# Patient Record
Sex: Female | Born: 1990 | Race: Black or African American | Hispanic: No | Marital: Single | Smoking: Never smoker
Health system: Southern US, Community
[De-identification: ages and names within clinical notes are randomized; demographics above are authoritative.]

## PROBLEM LIST (undated history)

## (undated) HISTORY — PX: WISDOM TOOTH EXTRACTION: SHX21

## (undated) HISTORY — PX: CHOLECYSTECTOMY: SHX55

---

## 2012-05-20 ENCOUNTER — Emergency Department (HOSPITAL_COMMUNITY)

## 2012-05-20 ENCOUNTER — Emergency Department (HOSPITAL_COMMUNITY)
Admission: EM | Admit: 2012-05-20 | Discharge: 2012-05-20 | Disposition: A | Discharge status: Home / Self Care | Attending: Emergency Medicine | Admitting: Emergency Medicine

## 2012-05-20 ENCOUNTER — Encounter (HOSPITAL_COMMUNITY): Payer: Self-pay | Admitting: Emergency Medicine

## 2012-05-20 DIAGNOSIS — R112 Nausea with vomiting, unspecified: Secondary | ICD-10-CM | POA: Insufficient documentation

## 2012-05-20 DIAGNOSIS — K8020 Calculus of gallbladder without cholecystitis without obstruction: Secondary | ICD-10-CM | POA: Insufficient documentation

## 2012-05-20 DIAGNOSIS — K8050 Calculus of bile duct without cholangitis or cholecystitis without obstruction: Secondary | ICD-10-CM

## 2012-05-20 DIAGNOSIS — R109 Unspecified abdominal pain: Secondary | ICD-10-CM

## 2012-05-20 LAB — CBC WITH DIFFERENTIAL/PLATELET
Eosinophils Absolute: 0 10*3/uL (ref 0.0–0.7)
Eosinophils Relative: 0 % (ref 0–5)
HCT: 34.9 % — ABNORMAL LOW (ref 36.0–46.0)
Lymphocytes Relative: 25 % (ref 12–46)
Lymphs Abs: 1.5 10*3/uL (ref 0.7–4.0)
MCH: 31.6 pg (ref 26.0–34.0)
MCV: 89 fL (ref 78.0–100.0)
Monocytes Absolute: 0.4 10*3/uL (ref 0.1–1.0)
Platelets: 220 10*3/uL (ref 150–400)
RBC: 3.92 MIL/uL (ref 3.87–5.11)
WBC: 5.9 10*3/uL (ref 4.0–10.5)

## 2012-05-20 LAB — PREGNANCY, URINE: Preg Test, Ur: NEGATIVE

## 2012-05-20 LAB — URINALYSIS, ROUTINE W REFLEX MICROSCOPIC
Nitrite: NEGATIVE
Protein, ur: NEGATIVE mg/dL
Specific Gravity, Urine: 1.026 (ref 1.005–1.030)
Urobilinogen, UA: 1 mg/dL (ref 0.0–1.0)

## 2012-05-20 LAB — LIPASE, BLOOD: Lipase: 24 U/L (ref 11–59)

## 2012-05-20 LAB — COMPREHENSIVE METABOLIC PANEL
BUN: 10 mg/dL (ref 6–23)
CO2: 20 mEq/L (ref 19–32)
Calcium: 9.9 mg/dL (ref 8.4–10.5)
Creatinine, Ser: 0.77 mg/dL (ref 0.50–1.10)
GFR calc Af Amer: >90 mL/min (ref >90)
GFR calc non Af Amer: >90 mL/min (ref >90)
Glucose, Bld: 110 mg/dL — ABNORMAL HIGH (ref 70–99)
Total Protein: 7.9 g/dL (ref 6.0–8.3)

## 2012-05-20 LAB — URINE MICROSCOPIC-ADD ON

## 2012-05-20 MED ORDER — OXYCODONE-ACETAMINOPHEN 5-325 MG PO TABS: ORAL_TABLET | ORAL | Status: DC

## 2012-05-20 MED ORDER — MORPHINE SULFATE 4 MG/ML IJ SOLN
4.0000 mg | Freq: Once | INTRAMUSCULAR | Status: AC
  Administered 2012-05-20: 4 mg via INTRAMUSCULAR
  Filled 2012-05-20: qty 1

## 2012-05-20 MED ORDER — KETOROLAC TROMETHAMINE 60 MG/2ML IM SOLN
60.0000 mg | Freq: Once | INTRAMUSCULAR | Status: AC
  Administered 2012-05-20: 60 mg via INTRAMUSCULAR
  Filled 2012-05-20: qty 2

## 2012-05-20 MED ORDER — ONDANSETRON HCL 4 MG PO TABS: 4.0000 mg | ORAL_TABLET | Freq: Three times a day (TID) | ORAL | Status: DC | PRN

## 2012-05-20 MED ORDER — ONDANSETRON 8 MG PO TBDP
8.0000 mg | ORAL_TABLET | Freq: Once | ORAL | Status: AC
  Administered 2012-05-20: 8 mg via ORAL
  Filled 2012-05-20: qty 1

## 2012-05-20 MED ORDER — POTASSIUM CHLORIDE 20 MEQ/15ML (10%) PO LIQD
40.0000 meq | Freq: Once | ORAL | Status: AC
  Administered 2012-05-20: 40 meq via ORAL
  Filled 2012-05-20: qty 30

## 2012-05-20 NOTE — ED Notes (Signed)
Patient presents with upper abdominal pain that radiates to back.  Patient denies fevers.  Patient also reports one episode of vomiting.

## 2012-05-20 NOTE — ED Notes (Signed)
Per EMS pt complaining of lower back pain, nausea and vomiting this morning, is on menstrual cycle but this is not the same cramping she's use to. When EMS were initially called out it was for Shortness of breath, RA 100%. Pt told them she has had some episodes of the same in the past, not stress related. BP 106/70, HR 50, RR 16. Pain 8/10.

## 2012-05-20 NOTE — ED Provider Notes (Signed)
History     CSN: [id number]  Arrival date & time 05/20/12  1329   First MD Initiated Contact with Patient 05/20/12 1617      Chief Complaint  Patient presents with  . Back Pain  . Nausea  . Emesis     HPI Pt was seen at 1650.   Per pt, c/o gradual onset and persistence of constant upper abd "pain" since this morning.  Has been associated with one episode of N/V.  Describes the abd pain as "cramping," and radiating into her back.  Denies diarrhea, no fevers, no back pain, no rash, no CP/SOB, no black or blood in stools or emesis.      History reviewed. No pertinent past medical history.  History reviewed. No pertinent past surgical history.    History  Substance Use Topics  . Smoking status: Never Smoker   . Smokeless tobacco: Not on file  . Alcohol Use: No     Review of Systems ROS: Statement: All systems negative except as marked or noted in the HPI; Constitutional: Negative for fever and chills. ; ; Eyes: Negative for eye pain, redness and discharge. ; ; ENMT: Negative for ear pain, hoarseness, nasal congestion, sinus pressure and sore throat. ; ; Cardiovascular: Negative for chest pain, palpitations, diaphoresis, dyspnea and peripheral edema. ; ; Respiratory: Negative for cough, wheezing and stridor. ; ; Gastrointestinal: +abd pain, N/V. Negative for diarrhea, blood in stool, hematemesis, jaundice and rectal bleeding. . ; ; Genitourinary: Negative for dysuria, flank pain and hematuria. ; ; Musculoskeletal: Negative for back pain and neck pain. Negative for swelling and trauma.; ; Skin: Negative for pruritus, rash, abrasions, blisters, bruising and skin lesion.; ; Neuro: Negative for headache, lightheadedness and neck stiffness. Negative for weakness, altered level of consciousness , altered mental status, extremity weakness, paresthesias, involuntary movement, seizure and syncope.      Allergies  Review of patient's allergies indicates no known allergies.  Home  Medications   Current Outpatient Rx  Name  Route  Sig  Dispense  Refill  . Multiple Vitamin (MULTIVITAMIN WITH MINERALS) TABS   Oral   Take 1 tablet by mouth daily.           BP 107/56  Pulse 62  Temp(Src) 97.3 F (36.3 C) (Oral)  Resp 16  SpO2 100%  LMP 05/16/2012  Physical Exam 1655: Physical examination:  Nursing notes reviewed; Vital signs and O2 SAT reviewed;  Constitutional: Well developed, Well nourished, Well hydrated, Uncomfortable appearing; Head:  Normocephalic, atraumatic; Eyes: EOMI, PERRL, No scleral icterus; ENMT: Mouth and pharynx normal, Mucous membranes moist; Neck: Supple, Full range of motion, No lymphadenopathy; Cardiovascular: Regular rate and rhythm, No murmur, rub, or gallop; Respiratory: Breath sounds clear & equal bilaterally, No rales, rhonchi, wheezes.  Speaking full sentences with ease, Normal respiratory effort/excursion; Chest: Nontender, Movement normal; Abdomen: Soft, +mild RUQ, mid-epigastric, LUQ tenderness to palp. No rebound or guarding. Nondistended, Normal bowel sounds; Genitourinary: No CVA tenderness; Extremities: Pulses normal, No tenderness, No edema, No calf edema or asymmetry.; Neuro: AA&Ox3, Major CN grossly intact.  Speech clear. No gross focal motor or sensory deficits in extremities.; Skin: Color normal, Warm, Dry.   ED Course  Procedures    MDM  MDM Reviewed: previous chart, nursing note and vitals Reviewed previous: labs Interpretation: labs and ultrasound   Results for orders placed during the hospital encounter of 05/20/12  CBC WITH DIFFERENTIAL      Result Value Range   WBC 5.9  4.0 -  10.5 K/uL   RBC 3.92  3.87 - 5.11 MIL/uL   Hemoglobin 12.4  12.0 - 15.0 g/dL   HCT 52.8 (*) 41.3 - 24.4 %   MCV 89.0  78.0 - 100.0 fL   MCH 31.6  26.0 - 34.0 pg   MCHC 35.5  30.0 - 36.0 g/dL   RDW 01.0  27.2 - 53.6 %   Platelets 220  150 - 400 K/uL   Neutrophils Relative 67  43 - 77 %   Neutro Abs 4.0  1.7 - 7.7 K/uL   Lymphocytes  Relative 25  12 - 46 %   Lymphs Abs 1.5  0.7 - 4.0 K/uL   Monocytes Relative 7  3 - 12 %   Monocytes Absolute 0.4  0.1 - 1.0 K/uL   Eosinophils Relative 0  0 - 5 %   Eosinophils Absolute 0.0  0.0 - 0.7 K/uL   Basophils Relative 0  0 - 1 %   Basophils Absolute 0.0  0.0 - 0.1 K/uL  COMPREHENSIVE METABOLIC PANEL      Result Value Range   Sodium 141  135 - 145 mEq/L   Potassium 3.1 (*) 3.5 - 5.1 mEq/L   Chloride 105  96 - 112 mEq/L   CO2 20  19 - 32 mEq/L   Glucose, Bld 110 (*) 70 - 99 mg/dL   BUN 10  6 - 23 mg/dL   Creatinine, Ser 6.44  0.50 - 1.10 mg/dL   Calcium 9.9  8.4 - 03.4 mg/dL   Total Protein 7.9  6.0 - 8.3 g/dL   Albumin 4.8  3.5 - 5.2 g/dL   AST 19  0 - 37 U/L   ALT 12  0 - 35 U/L   Alkaline Phosphatase 31 (*) 39 - 117 U/L   Total Bilirubin 0.4  0.3 - 1.2 mg/dL   GFR calc non Af Amer >90  >90 mL/min   GFR calc Af Amer >90  >90 mL/min  LIPASE, BLOOD      Result Value Range   Lipase 24  11 - 59 U/L  URINALYSIS, ROUTINE W REFLEX MICROSCOPIC      Result Value Range   Color, Urine YELLOW  YELLOW   APPearance TURBID (*) CLEAR   Specific Gravity, Urine 1.026  1.005 - 1.030   pH 8.0  5.0 - 8.0   Glucose, UA NEGATIVE  NEGATIVE mg/dL   Hgb urine dipstick MODERATE (*) NEGATIVE   Bilirubin Urine NEGATIVE  NEGATIVE   Ketones, ur 15 (*) NEGATIVE mg/dL   Protein, ur NEGATIVE  NEGATIVE mg/dL   Urobilinogen, UA 1.0  0.0 - 1.0 mg/dL   Nitrite NEGATIVE  NEGATIVE   Leukocytes, UA TRACE (*) NEGATIVE  PREGNANCY, URINE      Result Value Range   Preg Test, Ur NEGATIVE  NEGATIVE  URINE MICROSCOPIC-ADD ON      Result Value Range   Squamous Epithelial / LPF RARE  RARE   WBC, UA 0-2  <3 WBC/hpf   RBC / HPF 7-10  <3 RBC/hpf   Urine-Other AMORPHOUS URATES/PHOSPHATES     US Abdomen Complete 05/20/2012  *RADIOLOGY REPORT*  Clinical Data:  The upper abdominal pain, 2 months duration  COMPLETE ABDOMINAL ULTRASOUND  Comparison:  None.  Findings:  Gallbladder:  There are two gallstones, one  free within the gallbladder and the other apparently impacted in the gallbladder neck.  This stone measures 2 cm in diameter.  No gallbladder wall thickening or pericholecystic fluid.  Common  bile duct:  Normal at 3.5 mm.  Liver:  Normal echogenicity.  No focal lesions.  IVC:  Normal  Pancreas:  Normal  Spleen:  Normal at 5.7 cm  Right Kidney:  Normal at 13.1 cm.  Normal echogenicity.  No cyst, mass, stone or hydronephrosis  Left Kidney:  Similarly normal at 12.3 cm  Abdominal aorta:  Normal.  No aneurysm  No free fluid  IMPRESSION: Two large gallstones, one apparently impacted in the gallbladder neck.  No sonographic evidence of cholecystitis at this moment.   Original Report Authenticated By: Paulina Fusi, M.D.     2000:  Gallstone in GB neck on [place].  No signs of acute cholecystitis at this time.  WBC count normal, pt afebrile, resps easy, abd soft/NT. Tol PO well while in the ED without N/V.  States she feels "better" after meds and wants to go home.  Has gotten dressed and is walking around the ED, NAD, resps easy, gait steady. T/C to General Surgery Dr. Gerrit Friends, case discussed, including:  HPI, pertinent PM/SHx, VS/PE, dx testing, ED course and treatment:  States no need to admit pt at this time with normal labs, afebrile, and pain under control; he took pt's name/DOB and will have his office staff call the pt tomorrow to schedule a f/u appt tomorrow to discuss having her GB removed.  Dx and testing, as well as d/w General Surgeon, d/w pt and family.  Questions answered.  Verb understanding, agreeable to d/c home with outpt f/u. Pt will call General Surgery office herself if she does not receive a call from them tomorrow morning.          Laray Anger, DO 05/23/12 [phone]

## 2012-05-21 ENCOUNTER — Telehealth (INDEPENDENT_AMBULATORY_CARE_PROVIDER_SITE_OTHER): Payer: Self-pay | Admitting: *Deleted

## 2012-05-21 NOTE — Telephone Encounter (Signed)
Called and spoke to patient following receiving a message from Gerkin MD that patient spoke with him last night regarding symptomatic gallstones.  Patient states she is feeling much better so a regular new patient appt scheduled for 06/03/12 with Gerkin MD.

## 2012-05-27 ENCOUNTER — Encounter (INDEPENDENT_AMBULATORY_CARE_PROVIDER_SITE_OTHER): Payer: Self-pay

## 2012-06-03 ENCOUNTER — Ambulatory Visit (INDEPENDENT_AMBULATORY_CARE_PROVIDER_SITE_OTHER): Admitting: Surgery

## 2012-06-03 ENCOUNTER — Encounter (INDEPENDENT_AMBULATORY_CARE_PROVIDER_SITE_OTHER): Payer: Self-pay | Admitting: Surgery

## 2012-06-03 VITALS — BP 104/68 | HR 56 | Temp 96.8°F | Ht 66.0 in | Wt 138.0 lb

## 2012-06-03 DIAGNOSIS — K8010 Calculus of gallbladder with chronic cholecystitis without obstruction: Secondary | ICD-10-CM | POA: Insufficient documentation

## 2012-06-03 NOTE — Progress Notes (Signed)
General Surgery [facility]  Chief Complaint  Patient presents with  . New Evaluation    eval gallbladder - referral from Dr. 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 Symptoms resolved with pain medication. She returns to our office today to discuss scheduling of cholecystectomy for symptomatic cholelithiasis.  Patient denies any prior history of hepatobiliary or pancreatic disease. She has had intermittent episodes of biliary colic associated with nausea and vomiting approximately once a month for approximately 9 months. Namely history is positive for cholelithiasis and the patient's father.  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  History reviewed. No pertinent past medical history.   Current Outpatient Prescriptions  Medication Sig Dispense Refill  . Multiple Vitamin (MULTIVITAMIN WITH MINERALS) TABS Take 1 tablet by mouth daily.       No current facility-administered medications for this visit.     No Known Allergies   Family History  Problem Relation Age of Onset  . Cancer Cousin     breast     History   Social History  . Marital Status: Single    Spouse Name: N/A    Number of Children: N/A  . Years of Education: N/A   Social History Main Topics  . Smoking status: Never Smoker   . Smokeless tobacco: None  . Alcohol Use: No  . Drug Use: No  . Sexually Active: No   Other Topics Concern  . None   Social History Narrative  . None     REVIEW OF SYSTEMS - PERTINENT POSITIVES ONLY: Denies jaundice. Denies acholic stools. No history of hepatobiliary or pancreatic disease.  EXAM: Filed Vitals:   06/03/12 1607  BP: 104/68  Pulse: 56  Temp: 96.8 F (36 C)    HEENT: normocephalic; pupils equal and reactive; sclerae clear; dentition good; mucous membranes moist NECK:  symmetric on  extension; no palpable anterior or posterior cervical lymphadenopathy; no supraclavicular masses; no tenderness CHEST: clear to auscultation bilaterally without rales, rhonchi, or wheezes CARDIAC: regular rate and rhythm without significant murmur; peripheral pulses are full ABDOMEN: soft without distension; bowel sounds present; no mass; no hepatosplenomegaly; no hernia EXT:  non-tender without edema; no deformity NEURO: no gross focal deficits; no sign of tremor   LABORATORY RESULTS: See [facility] (CHL-Epic) for most recent results   RADIOLOGY RESULTS: See [facility] (CHL-Epic) for most recent results   IMPRESSION: Symptomatic cholelithiasis, intermittent biliary colic  PLAN: The patient and I had a lengthy discussion regarding cholecystectomy. I provided her with written literature to review. I believe she will be an excellent candidate for laparoscopic cholecystectomy with intraoperative cholangiography. Since she is from out of town we will provide an overnight stay postoperatively. Patient and I discussed the procedure, the potential complications, and the postoperative recovery to be anticipated.  She understands and wishes to proceed in the near future.  The risks and benefits of the procedure have been discussed at length with the patient.  The patient understands the proposed procedure, potential alternative treatments, and the course of recovery to be expected.  All of the patient's questions have been answered at this time.  The patient wishes to proceed with surgery.  Velora Heckler, MD, FACS General & Endocrine Surgery [facility]   Visit Diagnoses: 1. Cholelithiasis with cholecystitis     Primary Care Physician: No  primary provider on file.

## 2012-06-03 NOTE — Patient Instructions (Signed)
  [facility]  LAPAROSCOPIC SURGERY - POST-OP INSTRUCTIONS  Always review your discharge instruction sheet given to you by the facility where your surgery was performed.  A prescription for pain medication may be given to you upon discharge.  Take your pain medication as prescribed.  If narcotic pain medicine is not needed, then you may take acetaminophen (Tylenol) or ibuprofen (Advil) as needed.  Take your usually prescribed medications unless otherwise directed.  If you need a refill on your pain medication, please contact your pharmacy.  They will contact our office to request authorization. Prescriptions will not be filled after 5 P.M. or on weekends.  You should follow a light diet the first few days after arrival home, such as soup and crackers or toast.  Be sure to include plenty of fluids daily.  Most patients will experience some swelling and bruising in the area of the incisions.  Ice packs will help.  Swelling and bruising can take several days to resolve.   It is common to experience some constipation if taking pain medication after surgery.  Increasing fluid intake and taking a stool softener (such as Colace) will usually help or prevent this problem from occurring.  A mild laxative (Milk of Magnesia or Miralax) should be taken according to package instructions if there are no bowel movements after 48 hours.  Unless discharge instructions indicate otherwise, you may remove your bandages 24-48 hours after surgery, and you may shower at that time.  You may have steri-strips (small skin tapes) in place directly over the incision.  These strips should be left on the skin for 7-10 days.  If your surgeon used skin glue on the incision, you may shower in 24 hours.  The glue will flake off over the next 2-3 weeks.  Any sutures or staples will be removed at the office during your follow-up visit.  ACTIVITIES:  You may resume regular (light) daily activities beginning the  next day-such as daily self-care, walking, climbing stairs-gradually increasing activities as tolerated.  You may have sexual intercourse when it is comfortable.  Refrain from any heavy lifting or straining until approved by your doctor.  You may drive when you are no longer taking prescription pain medication, you can comfortably wear a seatbelt, and you can safely maneuver your car and apply brakes.  You should see your doctor in the office for a follow-up appointment approximately 2-3 weeks after your surgery.  Make sure that you call for this appointment within a day or two after you arrive home to insure a convenient appointment time.  WHEN TO CALL YOUR DOCTOR: 1. Fever over 101.0 2. Inability to urinate 3. Continued bleeding from incision 4. Increased pain, redness, or drainage from the incision 5. Increasing abdominal pain  The clinic staff is available to answer your questions during regular business hours.  Please don't hesitate to call and ask to speak to one of the nurses for clinical concerns.  If you have a medical emergency, go to the nearest emergency room or call 911.  A surgeon from [facility] is always on call for the hospital.  Edgardo Petrenko M. Toluwani Yadav, MD, FACS [facility] Office: [phone] Toll Free:  [phone] FAX [phone]  Web site: [email or web address] 

## 2012-06-25 ENCOUNTER — Other Ambulatory Visit (INDEPENDENT_AMBULATORY_CARE_PROVIDER_SITE_OTHER): Payer: Self-pay | Admitting: Surgery

## 2012-06-25 DIAGNOSIS — K8010 Calculus of gallbladder with chronic cholecystitis without obstruction: Secondary | ICD-10-CM

## 2012-07-16 ENCOUNTER — Ambulatory Visit (INDEPENDENT_AMBULATORY_CARE_PROVIDER_SITE_OTHER): Admitting: Surgery

## 2012-07-16 ENCOUNTER — Encounter (INDEPENDENT_AMBULATORY_CARE_PROVIDER_SITE_OTHER): Payer: Self-pay | Admitting: Surgery

## 2012-07-16 VITALS — BP 112/58 | HR 52 | Temp 97.4°F | Resp 16 | Ht 66.0 in | Wt 141.0 lb

## 2012-07-16 DIAGNOSIS — K8010 Calculus of gallbladder with chronic cholecystitis without obstruction: Secondary | ICD-10-CM

## 2012-07-16 NOTE — Patient Instructions (Signed)
  COCOA BUTTER & VITAMIN E CREAM  ([employer] or other brand)  Apply cocoa butter/vitamin E cream to your incision 2 - 3 times daily.  Massage cream into incision for one minute with each application.  Use sunscreen (50 SPF or higher) for first 6 months after surgery if area is exposed to sun.  You may substitute Mederma or other scar reducing creams as desired.   

## 2012-07-16 NOTE — Progress Notes (Signed)
General Surgery [facility]  Visit Diagnoses: 1. Cholelithiasis with cholecystitis     HISTORY: Patient returns for postoperative visit having undergone laparoscopic cholecystectomy with intraoperative cholangiography on 06/25/2012. 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 Postoperative course has been uneventful.  EXAM: Abdominal incisions are well-healed. Remaining Steri-Strips are removed. No sign of herniation. No sign of infection. Right upper quadrant is soft and nontender without mass.  IMPRESSION: Status post laparoscopic cholecystectomy  PLAN: Patient is released to full activity without restriction. She will begin applying topical creams to her incisions. She will return for follow-up as needed.  Velora Heckler, MD, FACS General & Endocrine Surgery [facility]

## 2014-01-01 IMAGING — US US ABDOMEN COMPLETE
1 series · 14 of 25 positions shown · non-contrast
Comparison: None.

CLINICAL DATA: The upper abdominal pain, 2 months duration

COMPLETE ABDOMINAL ULTRASOUND

[Series 1: us abdomen complete · 0.22mm/px · 14 of 78 slices shown]
[im 1/78]
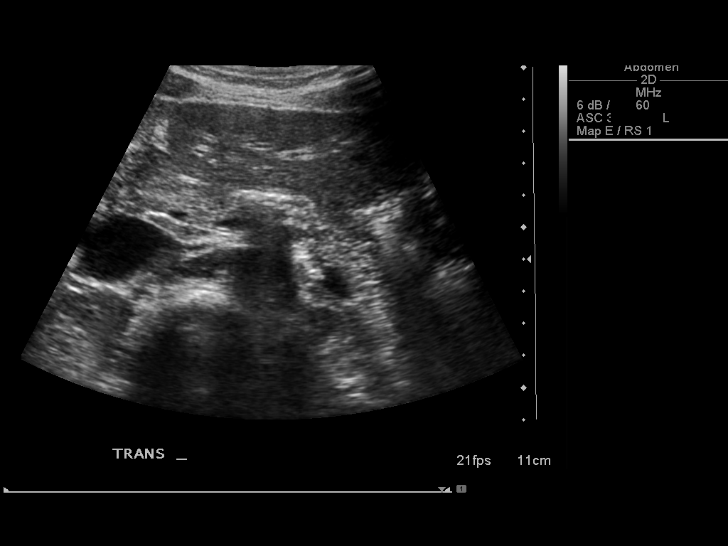
[im 7/78]
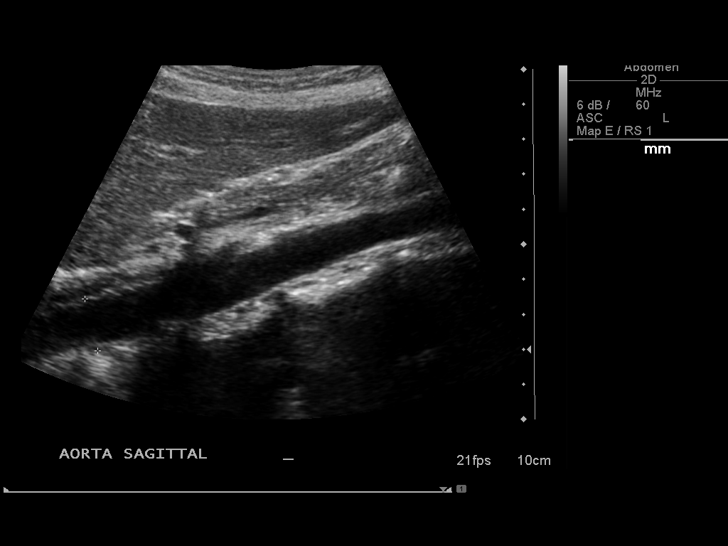
[im 13/78]
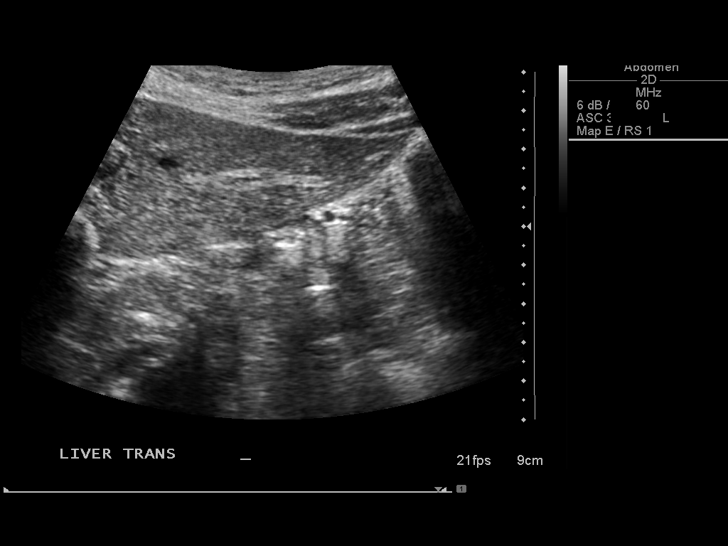
[im 20/78]
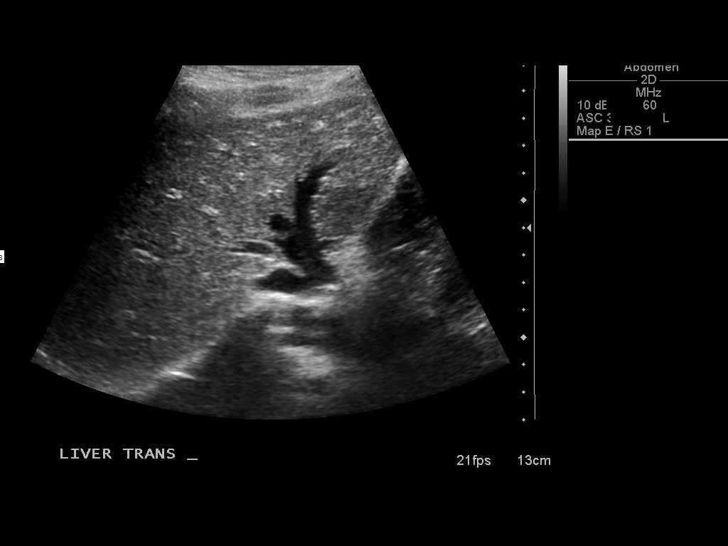
[im 26/78]
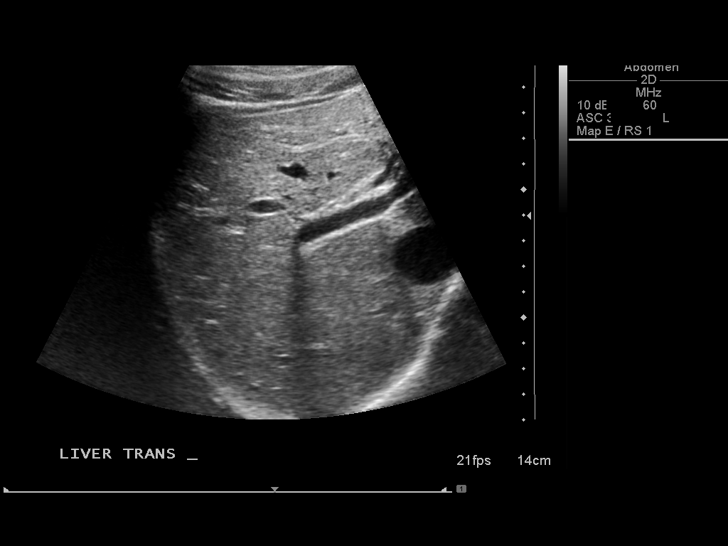
[im 29/78]
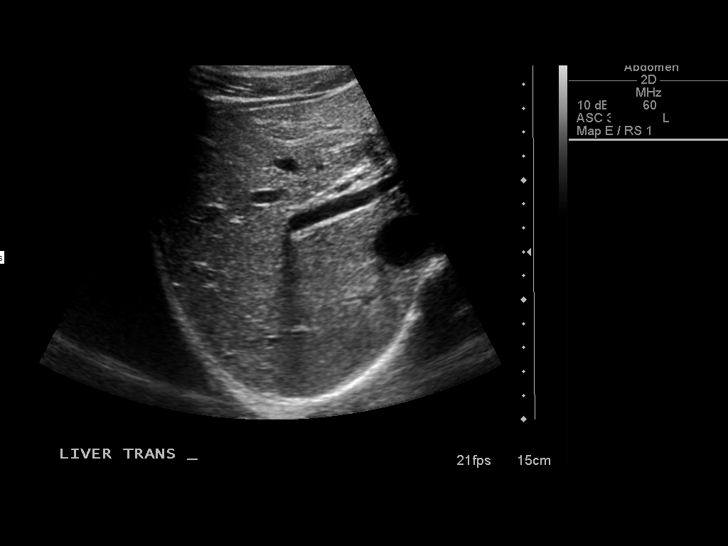
[im 36/78]
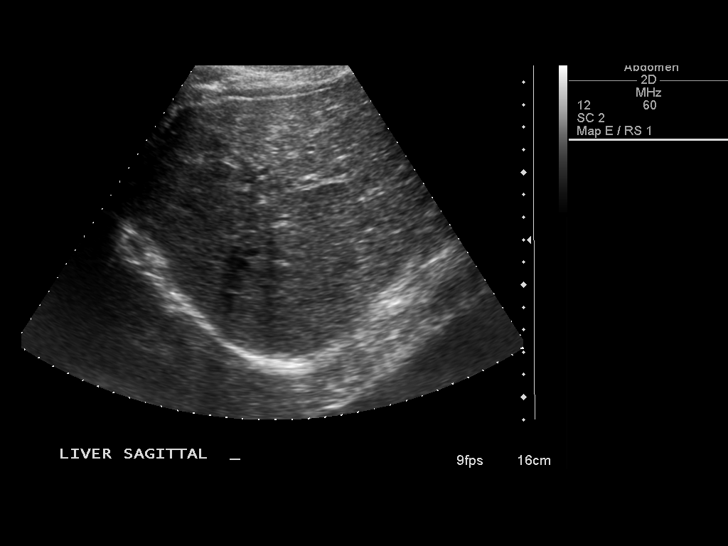
[im 42/78]
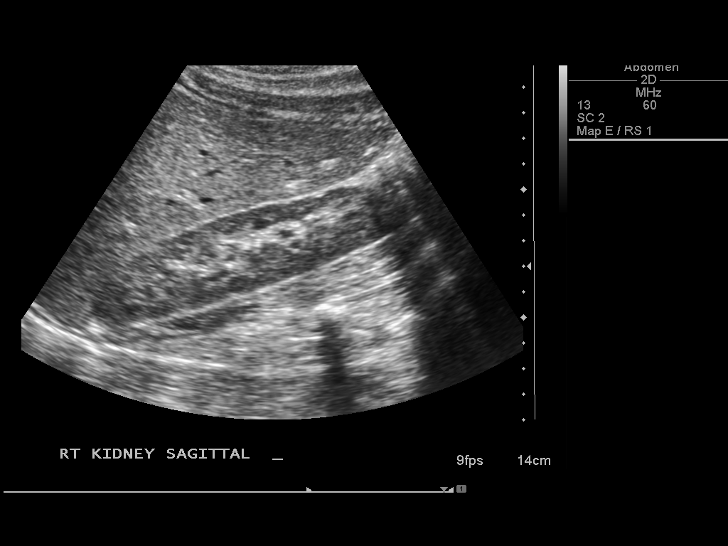
[im 49/78]
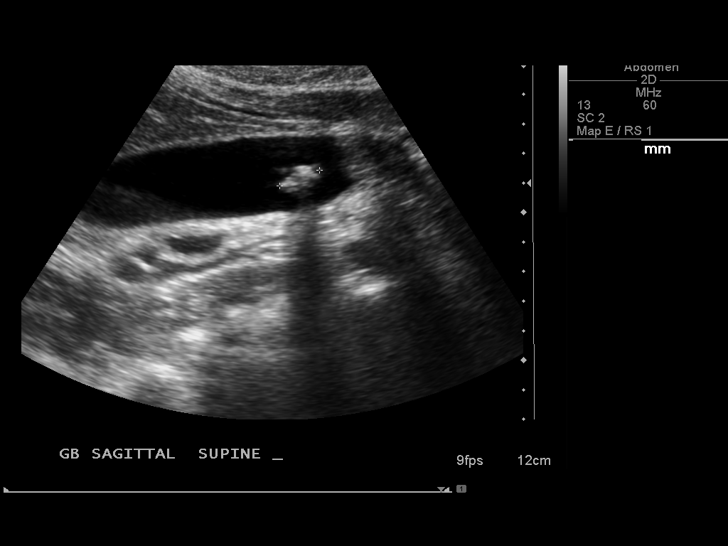
[im 52/78]
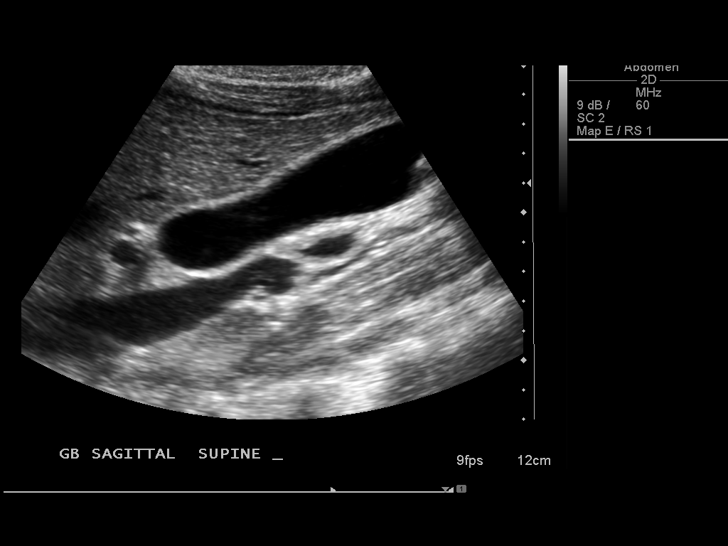
[im 58/78]
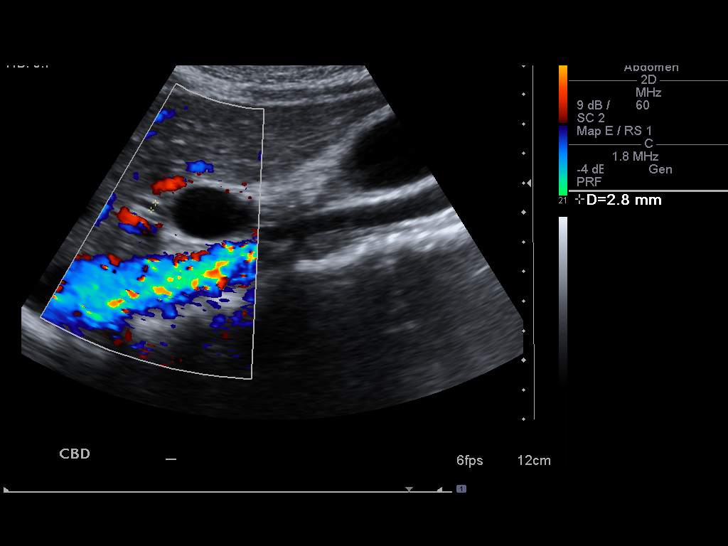
[im 65/78]
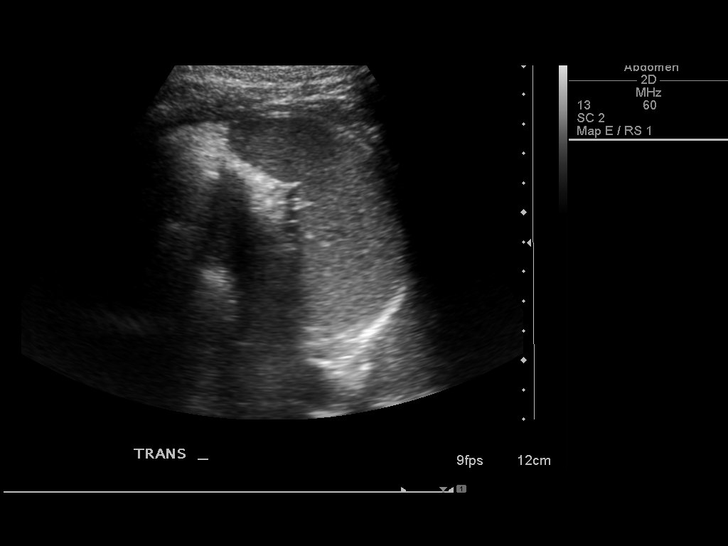
[im 71/78]
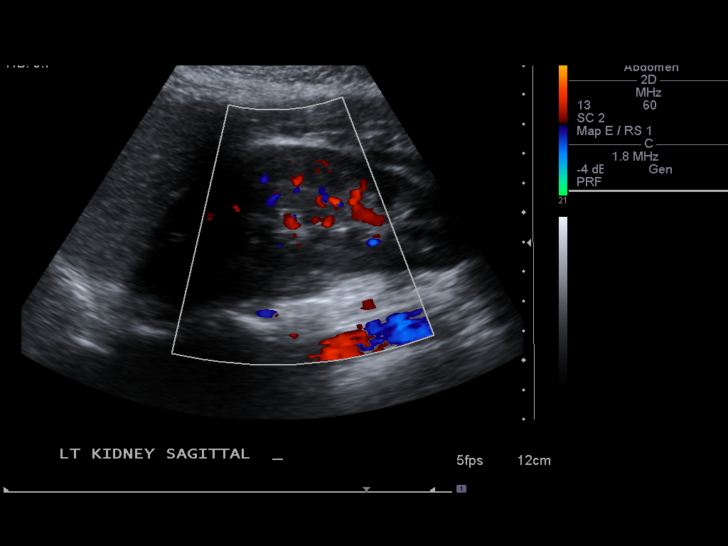
[im 78/78]
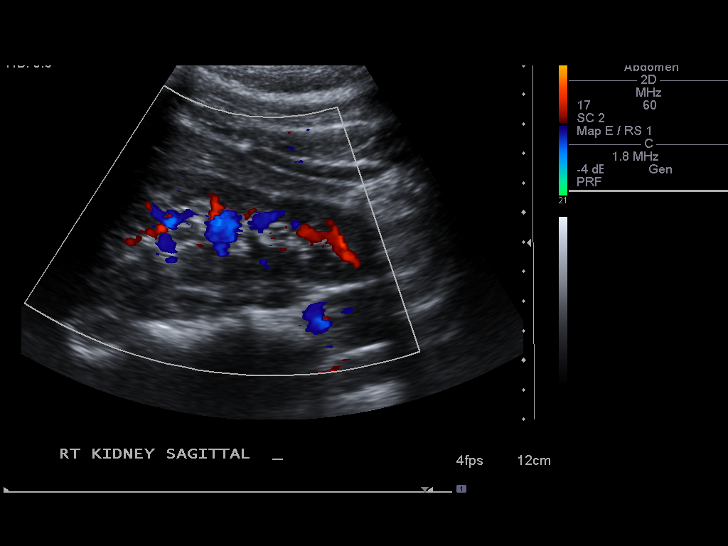

[14 of 25 positions shown; findings below may reference images not displayed]

FINDINGS: Gallbladder:  There are two gallstones, one free within the
gallbladder and the other apparently impacted in the gallbladder
neck.  This stone measures 2 cm in diameter.  No gallbladder wall
thickening or pericholecystic fluid.

Common bile duct:  Normal at 3.5 mm.

Liver:  Normal echogenicity.  No focal lesions.

IVC:  Normal

Pancreas:  Normal

Spleen:  Normal at 5.7 cm

Right Kidney:  Normal at 13.1 cm.  Normal echogenicity.  No cyst,
mass, stone or hydronephrosis

Left Kidney:  Similarly normal at 12.3 cm

Abdominal aorta:  Normal.  No aneurysm

No free fluid
IMPRESSION: Two large gallstones, one apparently impacted in the gallbladder
neck.  No sonographic evidence of cholecystitis at this moment.

## 2014-11-15 ENCOUNTER — Encounter (HOSPITAL_COMMUNITY): Payer: Self-pay | Admitting: Emergency Medicine

## 2014-11-15 ENCOUNTER — Emergency Department (HOSPITAL_COMMUNITY)
Admission: EM | Admit: 2014-11-15 | Discharge: 2014-11-15 | Disposition: A | Discharge status: Home / Self Care | Payer: TRICARE For Life (TFL) | Attending: Emergency Medicine | Admitting: Emergency Medicine

## 2014-11-15 DIAGNOSIS — Y9389 Activity, other specified: Secondary | ICD-10-CM | POA: Insufficient documentation

## 2014-11-15 DIAGNOSIS — Z79899 Other long term (current) drug therapy: Secondary | ICD-10-CM | POA: Insufficient documentation

## 2014-11-15 DIAGNOSIS — T23272A Burn of second degree of left wrist, initial encounter: Secondary | ICD-10-CM | POA: Insufficient documentation

## 2014-11-15 DIAGNOSIS — X100XXA Contact with hot drinks, initial encounter: Secondary | ICD-10-CM | POA: Insufficient documentation

## 2014-11-15 DIAGNOSIS — Y9289 Other specified places as the place of occurrence of the external cause: Secondary | ICD-10-CM | POA: Insufficient documentation

## 2014-11-15 DIAGNOSIS — Z23 Encounter for immunization: Secondary | ICD-10-CM | POA: Insufficient documentation

## 2014-11-15 DIAGNOSIS — Y998 Other external cause status: Secondary | ICD-10-CM | POA: Insufficient documentation

## 2014-11-15 MED ORDER — SILVER SULFADIAZINE 1 % EX CREA
TOPICAL_CREAM | Freq: Once | CUTANEOUS | Status: AC
  Administered 2014-11-15: 1 via TOPICAL
  Filled 2014-11-15: qty 85

## 2014-11-15 MED ORDER — CEPHALEXIN 500 MG PO CAPS: 500.0000 mg | ORAL_CAPSULE | Freq: Four times a day (QID) | ORAL | Status: AC

## 2014-11-15 MED ORDER — SILVER SULFADIAZINE 1 % EX CREA: 1.0000 "application " | TOPICAL_CREAM | Freq: Every day | CUTANEOUS | Status: AC

## 2014-11-15 MED ORDER — TETANUS-DIPHTH-ACELL PERTUSSIS 5-2.5-18.5 LF-MCG/0.5 IM SUSP
0.5000 mL | Freq: Once | INTRAMUSCULAR | Status: AC
  Administered 2014-11-15: 0.5 mL via INTRAMUSCULAR
  Filled 2014-11-15: qty 0.5

## 2014-11-15 NOTE — Discharge Instructions (Signed)
1. Medications: Keflex, silvadene, usual home medications 2. Treatment: rest, drink plenty of fluids, keep wound clean with warm soap and water 3. Follow Up: Please followup with your primary doctor in 3-5 days for wound check and discussion of your diagnoses and further evaluation after today's visit; if you do not have a primary care doctor use the resource guide provided to find one; Please return to the ER for signs of infection, fevers    Burn Care Your skin is a natural barrier to infection. It is the largest organ of your body. Burns damage this natural protection. To help prevent infection, it is very important to follow your caregiver's instructions in the care of your burn. Burns are classified as:  First degree. There is only redness of the skin (erythema). No scarring is expected.  Second degree. There is blistering of the skin. Scarring may occur with deeper burns.  Third degree. All layers of the skin are injured, and scarring is expected. HOME CARE INSTRUCTIONS   Wash your hands well before changing your bandage.  Change your bandage as often as directed by your caregiver.  Remove the old bandage. If the bandage sticks, you may soak it off with cool, clean water.  Cleanse the burn thoroughly but gently with mild soap and water.  Pat the area dry with a clean, dry cloth.  Apply a thin layer of antibacterial cream to the burn.  Apply a clean bandage as instructed by your caregiver.  Keep the bandage as clean and dry as possible.  Elevate the affected area for the first 24 hours, then as instructed by your caregiver.  Only take over-the-counter or prescription medicines for pain, discomfort, or fever as directed by your caregiver. SEEK IMMEDIATE MEDICAL CARE IF:   You develop excessive pain.  You develop redness, tenderness, swelling, or red streaks near the burn.  The burned area develops yellowish-white fluid (pus) or a bad smell.  You have a fever. MAKE  SURE YOU:   Understand these instructions.  Will watch your condition.  Will get help right away if you are not doing well or get worse.   This information is not intended to replace advice given to you by your health care provider. Make sure you discuss any questions you have with your health care provider.   Document Released: 01/23/2005 Document Revised: 04/17/2011 Document Reviewed: 06/15/2010 Elsevier Interactive Patient Education [employer]
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
